# Patient Record
Sex: Female | Born: 1950 | Race: White | Hispanic: No | Marital: Married | Smoking: Never smoker
Health system: Southern US, Community
[De-identification: ages and names within clinical notes are randomized; demographics above are authoritative.]

## PROBLEM LIST (undated history)

## (undated) DIAGNOSIS — F32A Depression, unspecified: Secondary | ICD-10-CM

## (undated) DIAGNOSIS — F329 Major depressive disorder, single episode, unspecified: Secondary | ICD-10-CM

## (undated) DIAGNOSIS — R251 Tremor, unspecified: Secondary | ICD-10-CM

## (undated) DIAGNOSIS — E119 Type 2 diabetes mellitus without complications: Secondary | ICD-10-CM

## (undated) HISTORY — PX: TONSILLECTOMY: SUR1361

---

## 2015-12-22 ENCOUNTER — Emergency Department (HOSPITAL_BASED_OUTPATIENT_CLINIC_OR_DEPARTMENT_OTHER): Payer: BLUE CROSS/BLUE SHIELD

## 2015-12-22 ENCOUNTER — Emergency Department (HOSPITAL_BASED_OUTPATIENT_CLINIC_OR_DEPARTMENT_OTHER)
Admission: EM | Admit: 2015-12-22 | Discharge: 2015-12-22 | Disposition: A | Discharge status: Home / Self Care | Payer: BLUE CROSS/BLUE SHIELD | Attending: Emergency Medicine | Admitting: Emergency Medicine

## 2015-12-22 ENCOUNTER — Encounter (HOSPITAL_BASED_OUTPATIENT_CLINIC_OR_DEPARTMENT_OTHER): Payer: Self-pay | Admitting: *Deleted

## 2015-12-22 DIAGNOSIS — R05 Cough: Secondary | ICD-10-CM | POA: Diagnosis present

## 2015-12-22 DIAGNOSIS — Z79899 Other long term (current) drug therapy: Secondary | ICD-10-CM | POA: Insufficient documentation

## 2015-12-22 DIAGNOSIS — F329 Major depressive disorder, single episode, unspecified: Secondary | ICD-10-CM | POA: Insufficient documentation

## 2015-12-22 DIAGNOSIS — E119 Type 2 diabetes mellitus without complications: Secondary | ICD-10-CM | POA: Diagnosis not present

## 2015-12-22 DIAGNOSIS — Z794 Long term (current) use of insulin: Secondary | ICD-10-CM | POA: Insufficient documentation

## 2015-12-22 DIAGNOSIS — J40 Bronchitis, not specified as acute or chronic: Secondary | ICD-10-CM | POA: Diagnosis not present

## 2015-12-22 HISTORY — DX: Type 2 diabetes mellitus without complications: E11.9

## 2015-12-22 HISTORY — DX: Tremor, unspecified: R25.1

## 2015-12-22 HISTORY — DX: Major depressive disorder, single episode, unspecified: F32.9

## 2015-12-22 HISTORY — DX: Depression, unspecified: F32.A

## 2015-12-22 MED ORDER — DEXAMETHASONE SODIUM PHOSPHATE 10 MG/ML IJ SOLN
10.0000 mg | Freq: Once | INTRAMUSCULAR | Status: AC
  Administered 2015-12-22: 10 mg via INTRAMUSCULAR
  Filled 2015-12-22: qty 1

## 2015-12-22 MED ORDER — IPRATROPIUM-ALBUTEROL 0.5-2.5 (3) MG/3ML IN SOLN
3.0000 mL | Freq: Four times a day (QID) | RESPIRATORY_TRACT | Status: DC
  Administered 2015-12-22: 3 mL via RESPIRATORY_TRACT
  Filled 2015-12-22: qty 3

## 2015-12-22 MED ORDER — PREDNISONE 20 MG PO TABS: ORAL_TABLET | ORAL | Status: AC

## 2015-12-22 NOTE — ED Notes (Signed)
Cough x 3 weeks. She was seen at a clinic a week ago and was given "a shot" that did not help. She is wheezing and feels SOB.

## 2015-12-22 NOTE — ED Provider Notes (Signed)
CSN: [id number]     Arrival date & time 12/22/15  1235 History   First MD Initiated Contact with Patient 12/22/15 1246     Chief Complaint  Patient presents with  . Cough     (Consider location/radiation/quality/duration/timing/severity/associated sxs/prior Treatment) HPI Comments: Patient presents with a cough and wheezing. She has a history of diabetes and recurrent bronchitis. She states she's had a cough for about 3 weeks. It is productive of some green yellow sputum. She denies any known fevers. She's been having intermittent wheezing and shortness of breath. She states she was seen in urgent care about a week ago in [facility]. She was given a shot of Decadron and felt better over the next 2-3 days. She's been taking doxycycline as well which she was prescribed during that visit. She was not prescribed oral steroids given that she's states that it normally elevates her blood sugar. She denies any known fevers. She states over last 2-3 days she feels like she's been getting worse again with increased wheezing and coughing. No nausea or vomiting. She's been using her nebulizer machine at home although has not used it today. She also has an albuterol inhaler which she uses intermittently. She denies any leg pain or swelling. She denies any chest pain other than feeling tight and wheezy.  Patient is a 65 y.o. female presenting with cough.  Cough Associated symptoms: shortness of breath and wheezing   Associated symptoms: no chest pain, no chills, no diaphoresis, no fever, no headaches, no rash and no rhinorrhea     Past Medical History  Diagnosis Date  . Diabetes mellitus without complication (HCC)   . Depression   . Occasional tremors    Past Surgical History  Procedure Laterality Date  . Cesarean section    . Tonsillectomy     No family history on file. Social History  Substance Use Topics  . Smoking status: Never Smoker   . Smokeless tobacco: None  . Alcohol Use:  No   OB History    No data available     Review of Systems  Constitutional: Positive for fatigue. Negative for fever, chills and diaphoresis.  HENT: Negative for congestion, rhinorrhea and sneezing.   Eyes: Negative.   Respiratory: Positive for cough, shortness of breath and wheezing. Negative for chest tightness.   Cardiovascular: Negative for chest pain and leg swelling.  Gastrointestinal: Negative for nausea, vomiting, abdominal pain, diarrhea and blood in stool.  Genitourinary: Negative for frequency, hematuria, flank pain and difficulty urinating.  Musculoskeletal: Negative for back pain and arthralgias.  Skin: Negative for rash.  Neurological: Negative for dizziness, speech difficulty, weakness, numbness and headaches.      Allergies  Review of patient's allergies indicates no known allergies.  Home Medications   Prior to Admission medications   Medication Sig Start Date End Date Taking? Authorizing Provider  citalopram (CELEXA) 40 MG tablet Take 40 mg by mouth daily.   Yes Historical Provider, MD  CLONAZEPAM PO Take by mouth.   Yes Historical Provider, MD  DOXYCYCLINE PO Take by mouth.   Yes Historical Provider, MD  Furosemide (LASIX PO) Take by mouth.   Yes Historical Provider, MD  insulin glargine (LANTUS) 100 UNIT/ML injection Inject into the skin at bedtime.   Yes Historical Provider, MD  insulin lispro (HUMALOG) 100 UNIT/ML injection Inject into the skin 3 (three) times daily before meals.   Yes Historical Provider, MD  metFORMIN (GLUCOPHAGE) 1000 MG tablet Take 1,000 mg by mouth 2 (  two) times daily with a meal.   Yes Historical Provider, MD  Methocarbamol (ROBAXIN PO) Take by mouth.   Yes Historical Provider, MD  modafinil (PROVIGIL) 200 MG tablet Take 200 mg by mouth daily.   Yes Historical Provider, MD  pantoprazole (PROTONIX) 40 MG tablet Take 40 mg by mouth daily.   Yes Historical Provider, MD  predniSONE (DELTASONE) 20 MG tablet 1 tabs po daily x 4 days 12/22/15    Rolan BuccoMelanie Traver Meckes, MD  primidone (MYSOLINE) 50 MG tablet Take by mouth 4 (four) times daily.   Yes Historical Provider, MD  TRAMADOL HCL PO Take by mouth.   Yes Historical Provider, MD   BP 135/59 mmHg  Pulse 80  Resp 20  SpO2 98% Physical Exam  Constitutional: She is oriented to person, place, and time. She appears well-developed and well-nourished.  HENT:  Head: Normocephalic and atraumatic.  Eyes: Pupils are equal, round, and reactive to light.  Neck: Normal range of motion. Neck supple.  Cardiovascular: Normal rate, regular rhythm and normal heart sounds.   Pulmonary/Chest: Effort normal. No respiratory distress. She has wheezes. She has no rales. She exhibits no tenderness.  Moderate expiratory wheezes bilaterally, mild increased work of breathing  Abdominal: Soft. Bowel sounds are normal. There is no tenderness. There is no rebound and no guarding.  Musculoskeletal: Normal range of motion. She exhibits no edema.  Lymphadenopathy:    She has no cervical adenopathy.  Neurological: She is alert and oriented to person, place, and time.  Skin: Skin is warm and dry. No rash noted.  Psychiatric: She has a normal mood and affect.    ED Course  Procedures (including critical care time) Labs Review Labs Reviewed - No data to display  Imaging Review Dg Chest 2 View  12/22/2015  CLINICAL DATA:  65 year old female with cough and wheezing for the past 3 weeks. EXAM: CHEST  2 VIEW COMPARISON:  No priors. FINDINGS: Elevated right hemidiaphragm. Lung volumes are normal. No consolidative airspace disease. No pleural effusions. No pneumothorax. No pulmonary nodule or mass noted. Pulmonary vasculature and the cardiomediastinal silhouette are within normal limits. IMPRESSION: 1.  No radiographic evidence of acute cardiopulmonary disease. 2. Elevated right hemidiaphragm. Electronically Signed   By: Trudie Reedaniel  Entrikin M.D.   On: 12/22/2015 13:16   I have personally reviewed and evaluated these images and  lab results as part of my medical decision-making.   EKG Interpretation None      MDM   Final diagnoses:  Bronchitis    Patient presents with cough and wheezing. She has no evidence of pneumonia. She's currently on doxycycline for bronchitis treatment. She received a nebulizer treatment here as well as a dose of Decadron. Her lung sounds are improved. She has no increased work of breathing. She still has some mild expiratory wheezing but is maintaining normal oxygen saturations and talking in full sentences. I do feel that she would benefit from a short course of steroids and I discussed this with her. She has sliding scale insulin that she can use. I encouraged her to check her blood sugars frequently over the next several days and adjust her sliding scale insulin accordingly. I encouraged her to have close follow-up with her PCP. She was advised return here if she has any worsening symptoms.    Rolan BuccoMelanie Mescal Flinchbaugh, MD 12/22/15 [phone]

## 2015-12-22 NOTE — Discharge Instructions (Signed)
Upper Respiratory Infection, Adult Most upper respiratory infections (URIs) are a viral infection of the air passages leading to the lungs. A URI affects the nose, throat, and upper air passages. The most common type of URI is nasopharyngitis and is typically referred to as "the common cold." URIs run their course and usually go away on their own. Most of the time, a URI does not require medical attention, but sometimes a bacterial infection in the upper airways can follow a viral infection. This is called a secondary infection. Sinus and middle ear infections are common types of secondary upper respiratory infections. Bacterial pneumonia can also complicate a URI. A URI can worsen asthma and chronic obstructive pulmonary disease (COPD). Sometimes, these complications can require emergency medical care and may be life threatening.  CAUSES Almost all URIs are caused by viruses. A virus is a type of germ and can spread from one person to another.  RISKS FACTORS You may be at risk for a URI if:   You smoke.   You have chronic heart or lung disease.  You have a weakened defense (immune) system.   You are very young or very old.   You have nasal allergies or asthma.  You work in crowded or poorly ventilated areas.  You work in health care facilities or schools. SIGNS AND SYMPTOMS  Symptoms typically develop 2-3 days after you come in contact with a cold virus. Most viral URIs last 7-10 days. However, viral URIs from the influenza virus (flu virus) can last 14-18 days and are typically more severe. Symptoms may include:   Runny or stuffy (congested) nose.   Sneezing.   Cough.   Sore throat.   Headache.   Fatigue.   Fever.   Loss of appetite.   Pain in your forehead, behind your eyes, and over your cheekbones (sinus pain).  Muscle aches.  DIAGNOSIS  Your health care provider may diagnose a URI by:  Physical exam.  Tests to check that your symptoms are not due to  another condition such as:  Strep throat.  Sinusitis.  Pneumonia.  Asthma. TREATMENT  A URI goes away on its own with time. It cannot be cured with medicines, but medicines may be prescribed or recommended to relieve symptoms. Medicines may help:  Reduce your fever.  Reduce your cough.  Relieve nasal congestion. HOME CARE INSTRUCTIONS   Take medicines only as directed by your health care provider.   Gargle warm saltwater or take cough drops to comfort your throat as directed by your health care provider.  Use a warm mist humidifier or inhale steam from a shower to increase air moisture. This may make it easier to breathe.  Drink enough fluid to keep your urine clear or pale yellow.   Eat soups and other clear broths and maintain good nutrition.   Rest as needed.   Return to work when your temperature has returned to normal or as your health care provider advises. You may need to stay home longer to avoid infecting others. You can also use a face mask and careful hand washing to prevent spread of the virus.  Increase the usage of your inhaler if you have asthma.   Do not use any tobacco products, including cigarettes, chewing tobacco, or electronic cigarettes. If you need help quitting, ask your health care provider. PREVENTION  The best way to protect yourself from getting a cold is to practice good hygiene.   Avoid oral or hand contact with people with cold   symptoms.   Wash your hands often if contact occurs.  There is no clear evidence that vitamin C, vitamin E, echinacea, or exercise reduces the chance of developing a cold. However, it is always recommended to get plenty of rest, exercise, and practice good nutrition.  SEEK MEDICAL CARE IF:   You are getting worse rather than better.   Your symptoms are not controlled by medicine.   You have chills.  You have worsening shortness of breath.  You have brown or red mucus.  You have yellow or brown nasal  discharge.  You have pain in your face, especially when you bend forward.  You have a fever.  You have swollen neck glands.  You have pain while swallowing.  You have white areas in the back of your throat. SEEK IMMEDIATE MEDICAL CARE IF:   You have severe or persistent:  Headache.  Ear pain.  Sinus pain.  Chest pain.  You have chronic lung disease and any of the following:  Wheezing.  Prolonged cough.  Coughing up blood.  A change in your usual mucus.  You have a stiff neck.  You have changes in your:  Vision.  Hearing.  Thinking.  Mood. MAKE SURE YOU:   Understand these instructions.  Will watch your condition.  Will get help right away if you are not doing well or get worse.   This information is not intended to replace advice given to you by your health care provider. Make sure you discuss any questions you have with your health care provider.   Document Released: 06/01/2001 Document Revised: 04/22/2015 Document Reviewed: 03/13/2014 Elsevier Interactive Patient Education 2016 [employer]  

## 2016-07-26 IMAGING — CR DG CHEST 2V
2 series · 2 of 2 positions shown · non-contrast
Comparison: No priors.

CLINICAL DATA: 64-year-old female with cough and wheezing for the
past 3 weeks.

EXAM:
CHEST  2 VIEW

[w chest pa]
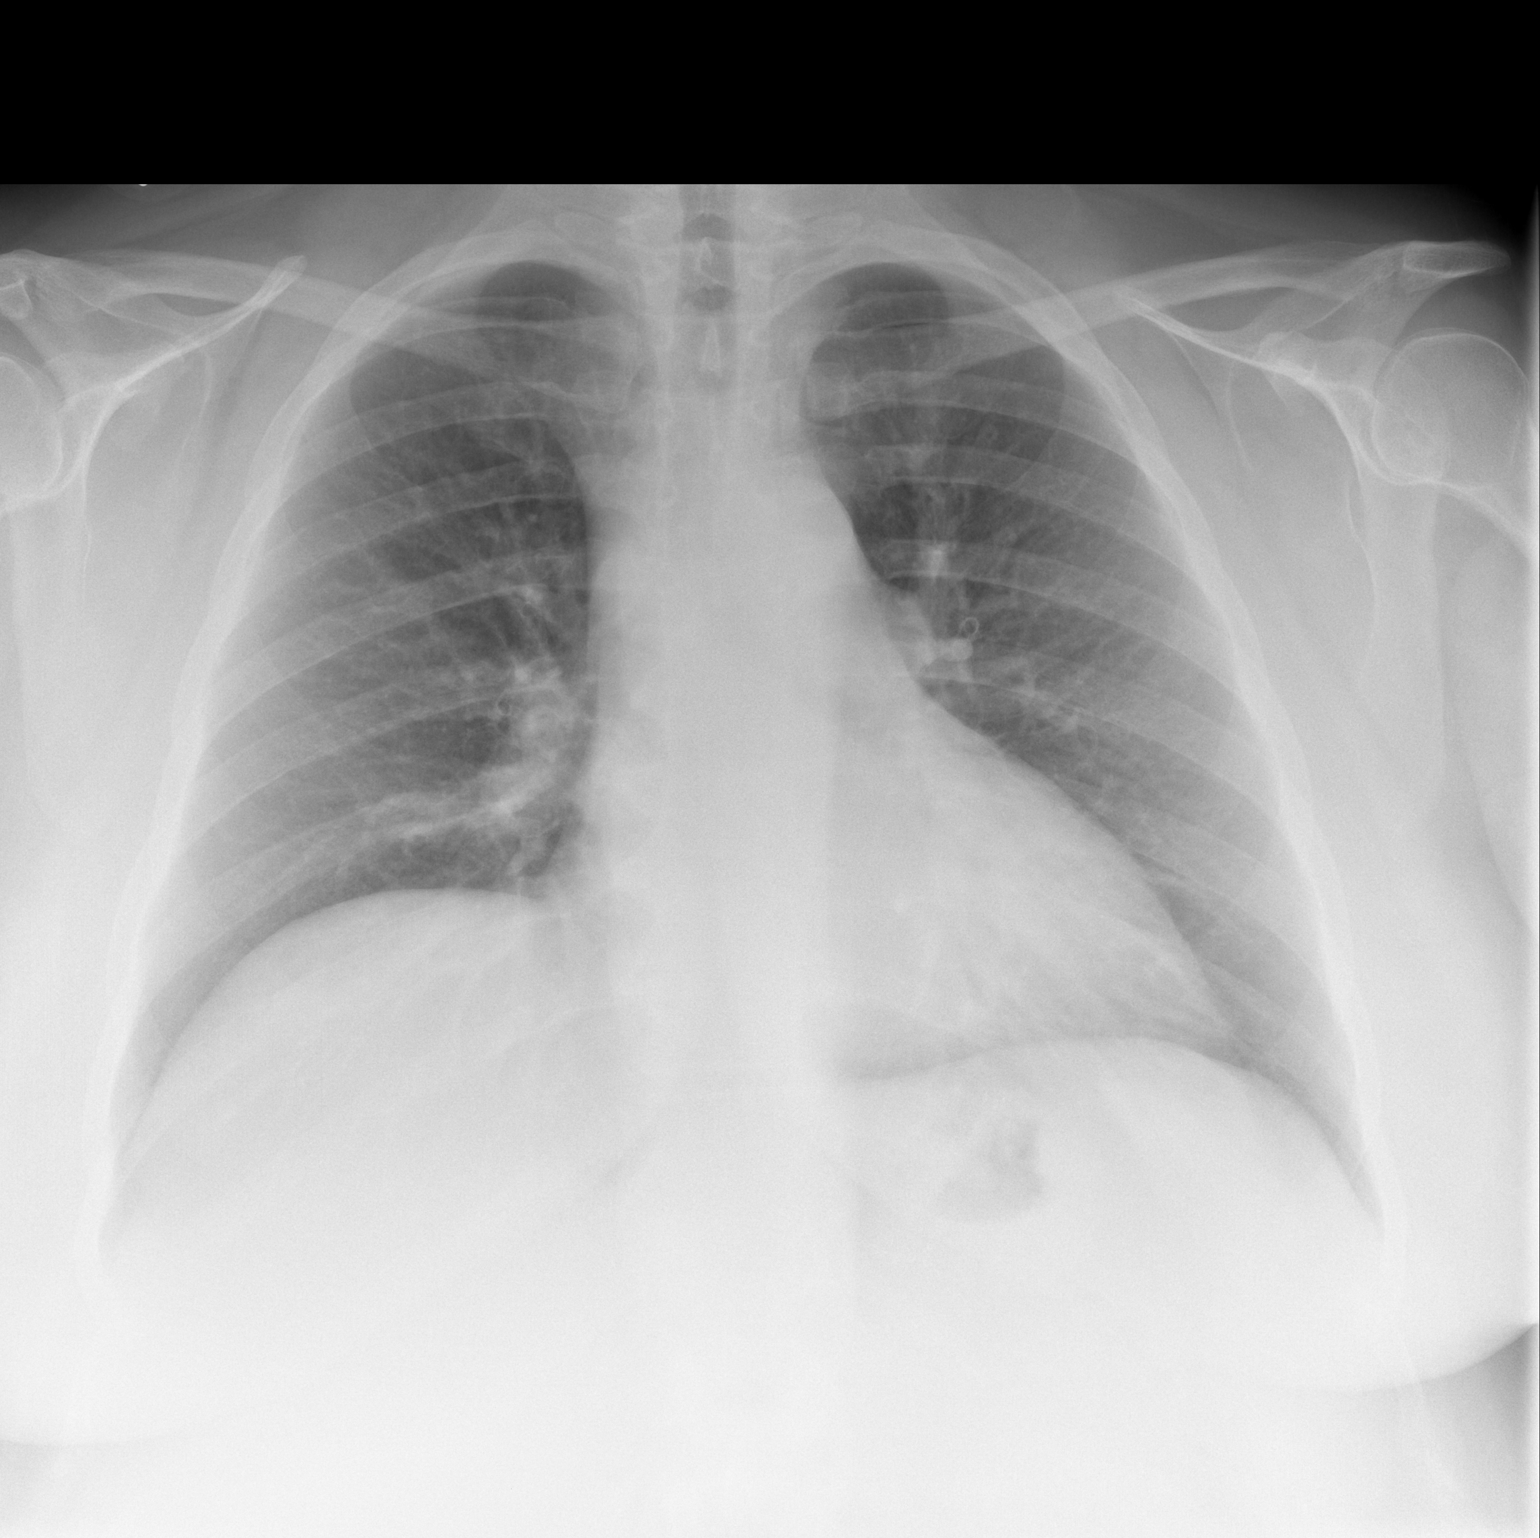

[w chest lat]
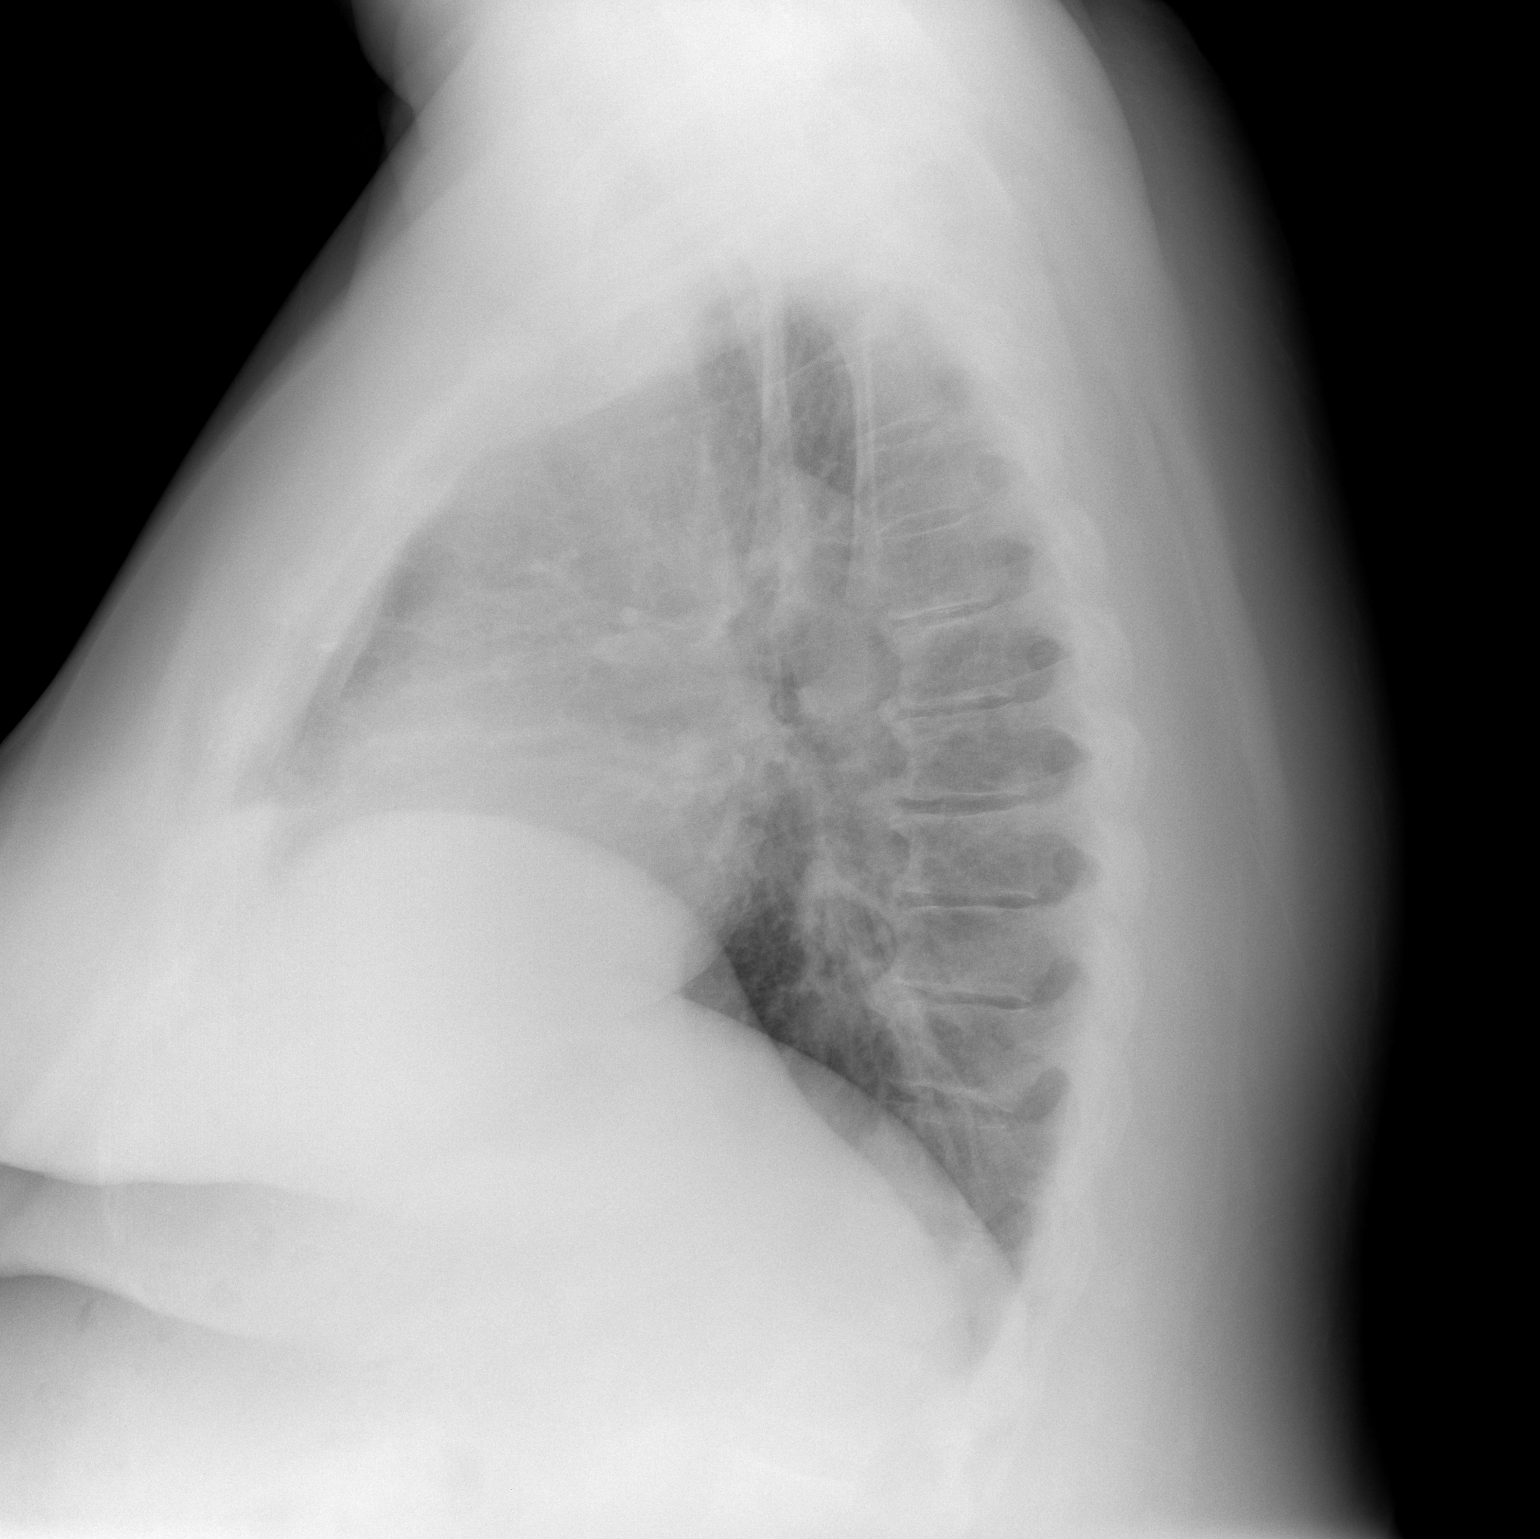

[2 of 2 positions shown; findings below may reference images not displayed]

FINDINGS: Elevated right hemidiaphragm. Lung volumes are normal. No
consolidative airspace disease. No pleural effusions. No
pneumothorax. No pulmonary nodule or mass noted. Pulmonary
vasculature and the cardiomediastinal silhouette are within normal
limits.
IMPRESSION: 1.  No radiographic evidence of acute cardiopulmonary disease.
2. Elevated right hemidiaphragm.
